# Patient Record
Sex: Male | Born: 1960 | Race: Black or African American | Hispanic: No | Marital: Single | Smoking: Current some day smoker
Health system: Southern US, Community
[De-identification: ages and names within clinical notes are randomized; demographics above are authoritative.]

---

## 2005-02-07 ENCOUNTER — Encounter: Admission: RE | Admit: 2005-02-07 | Discharge: 2005-02-07 | Payer: Self-pay | Admitting: Family Medicine

## 2005-02-11 ENCOUNTER — Encounter: Admission: RE | Admit: 2005-02-11 | Discharge: 2005-02-11 | Payer: Self-pay | Admitting: Family Medicine

## 2006-06-16 IMAGING — CT CT ABDOMEN W/ CM
1 of 2 series · 15 of 32 positions shown, 19 images · IV contrast (BAROCAT & WATER & [ID] OMNI 300)
Comparison: None

ABDOMEN CT WITH CONTRAST

CLINICAL DATA: Bilateral lower quadrant abdominal pain, nausea, vomiting
TECHNIQUE: Multidetector CT imaging of the abdomen and pelvis was performed
following the standard protocol during bolus administration of intravenous
contrast.

Contrast:  125 cc Omnipaque 300

[Series 2: a&p w/ · axial · 0.70mm/px · z∈[-442,-32]mm · 15 of 90 slices shown, 19 images]
[im 4/90  soft-tissue]
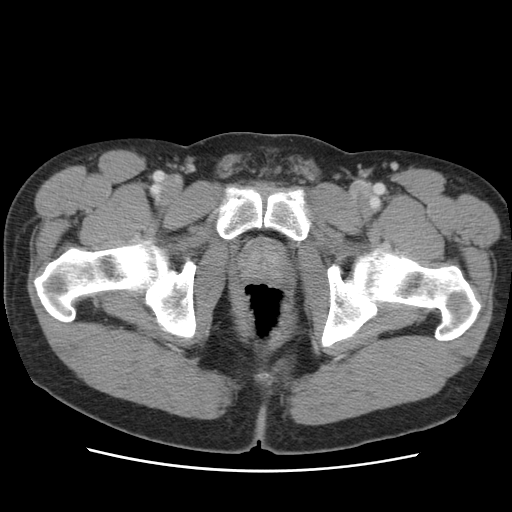
[im 4/90  bone]
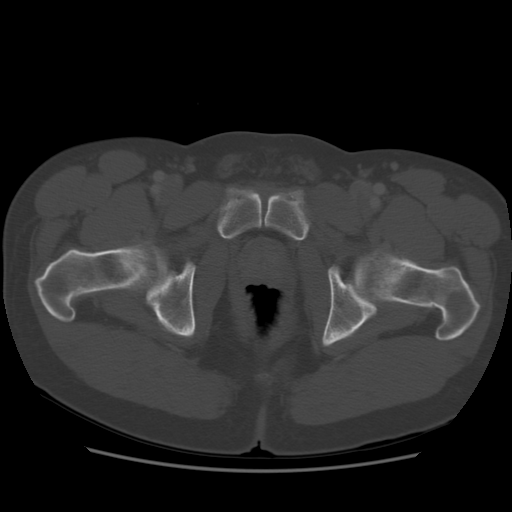
[im 12/90  soft-tissue]
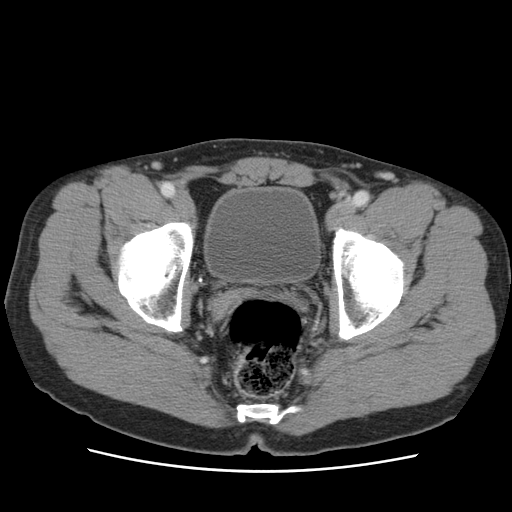
[im 20/90  soft-tissue]
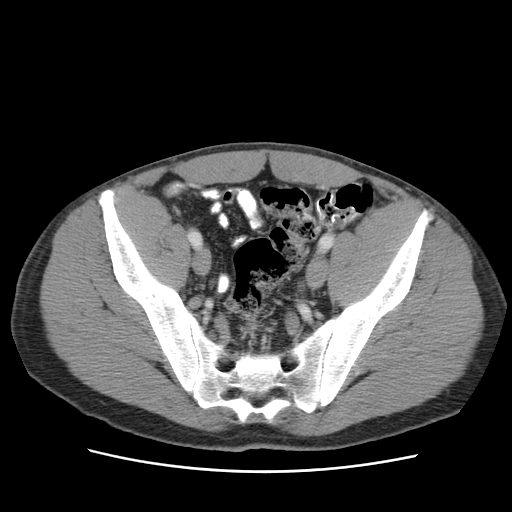
[im 24/90  soft-tissue]
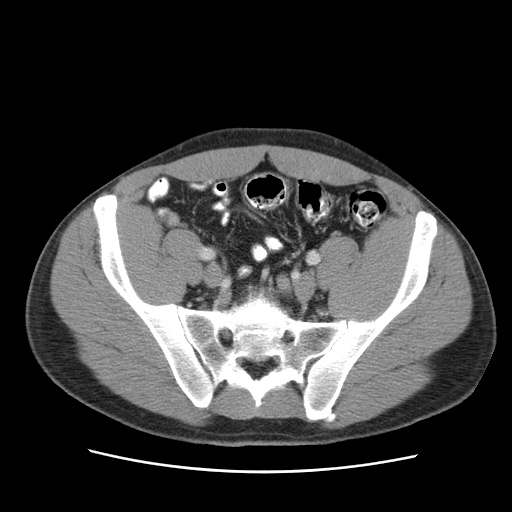
[im 31/90  soft-tissue]
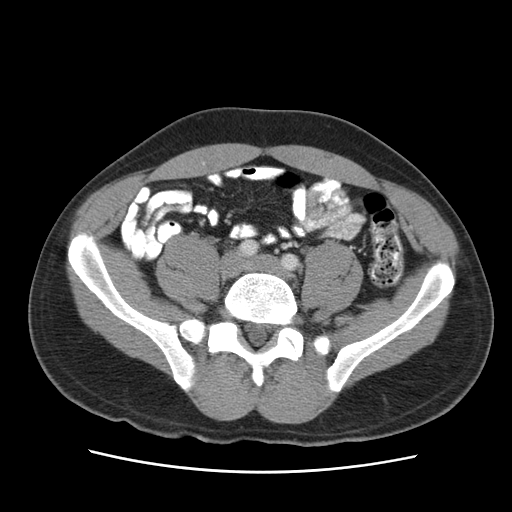
[im 39/90  soft-tissue]
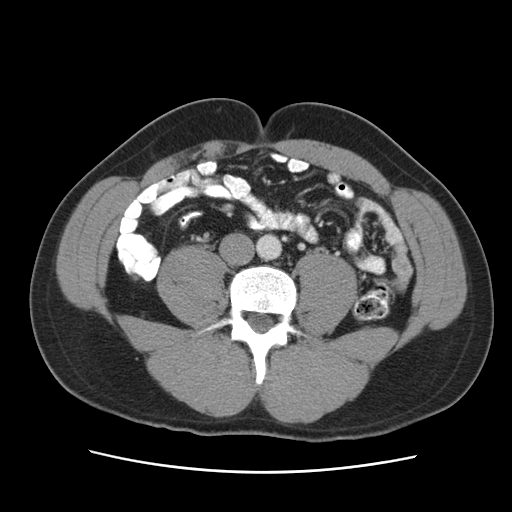
[im 47/90  soft-tissue]
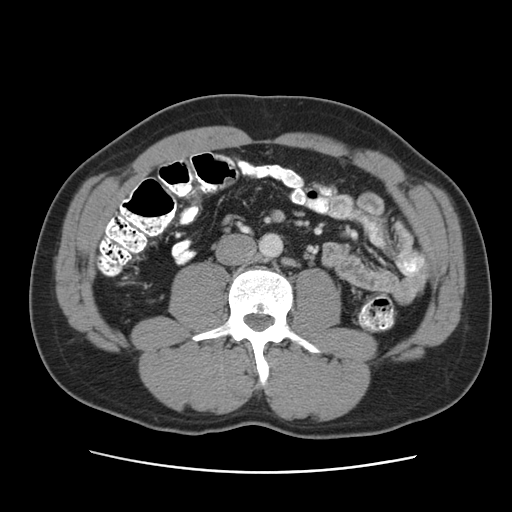
[im 51/90  soft-tissue]
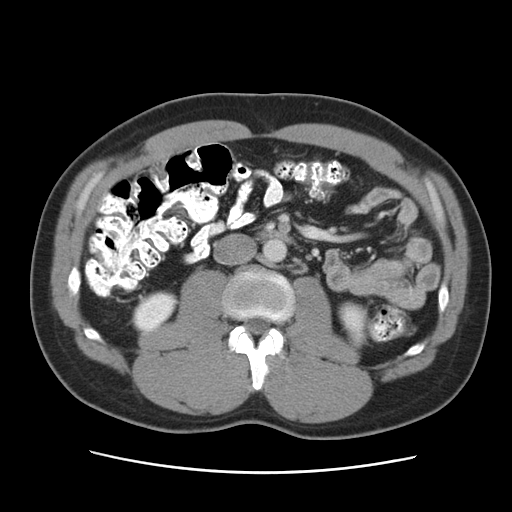
[im 59/90  soft-tissue]
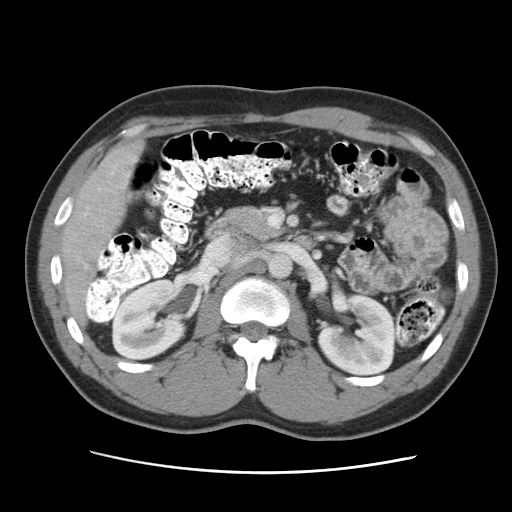
[im 59/90  bone]
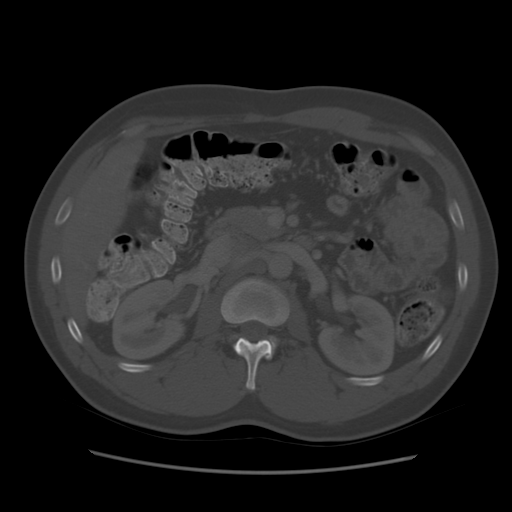
[im 66/90  soft-tissue]
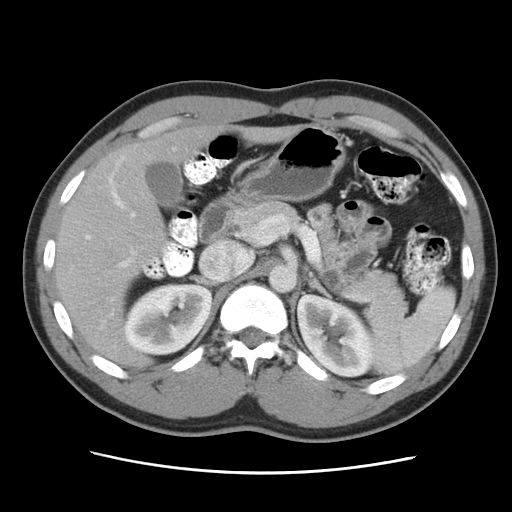
[im 70/90  soft-tissue]
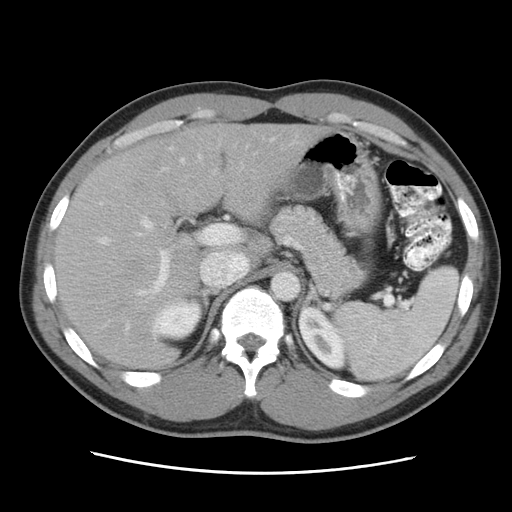
[im 74/90  lung]
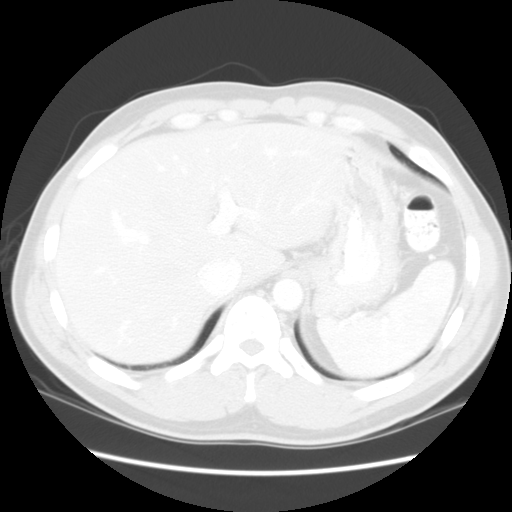
[im 78/90  soft-tissue]
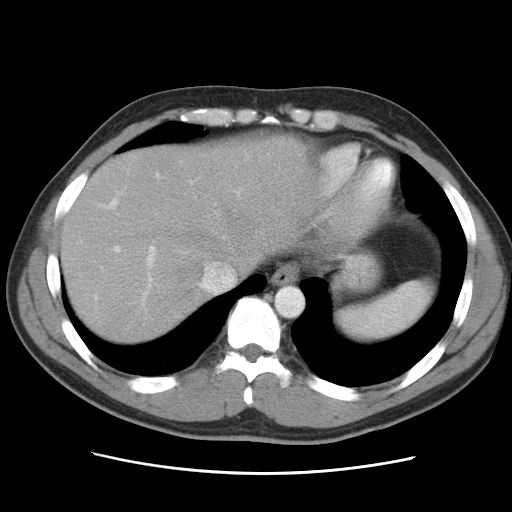
[im 78/90  lung]
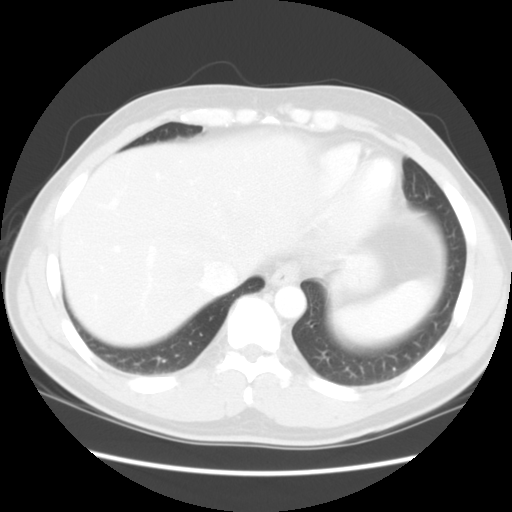
[im 82/90  lung]
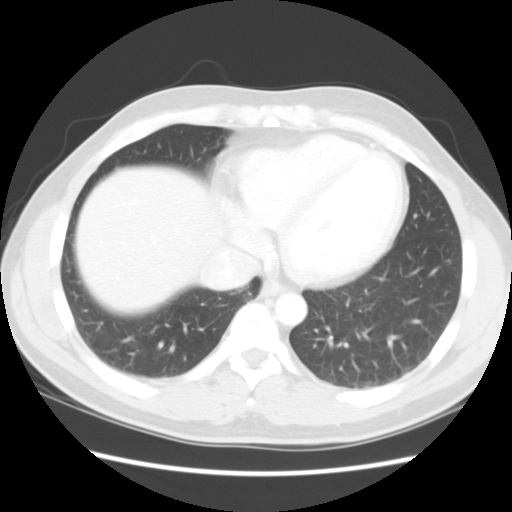
[im 86/90  soft-tissue]
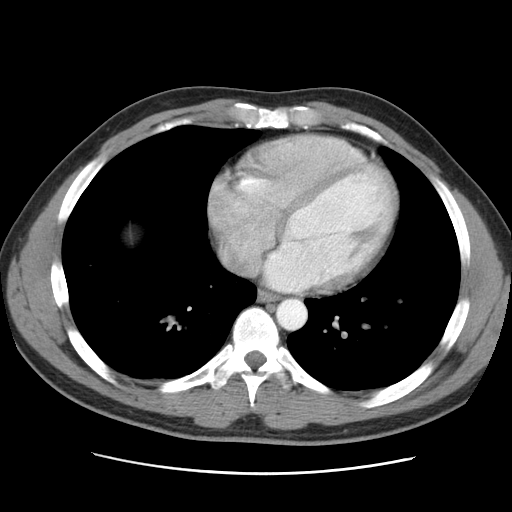
[im 86/90  lung]
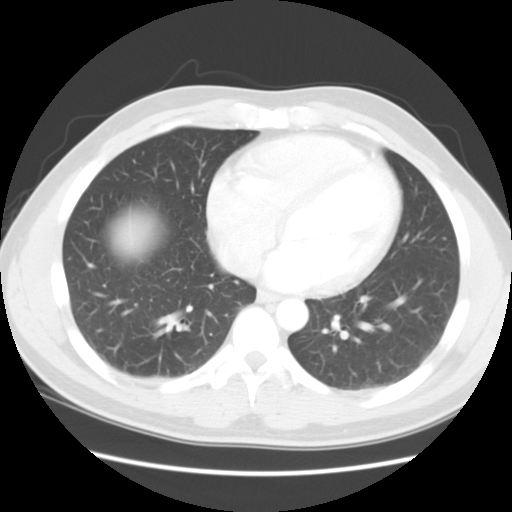

[15 of 32 positions shown; findings below may reference images not displayed]

FINDINGS: Liver, spleen, pancreas, adrenals, kidneys unremarkable. Gallbladder
unremarkable. No free fluid, free air, or adenopathy.

The appendix is mildly prominent measuring 10 mm, but this does contain some gas
and there is no inflammatory change around the appendix. I favor this is normal
for this patient, but recommend clinical correlation for right lower quadrant
pain, fever, or elevated white count.

IMPRESSION

Appendix diameter is above the normal range, but there is no inflammatory change
and the appendix is not fluid filled. I favor that this is normal for this
patient, recommend clinical correlation for right lower quadrant pain, fever, or
elevated white count.

PELVIS CT WITH CONTRAST
FINDINGS: No free fluid, free air, or adenopathy. Pelvic structures
unremarkable. No evidence of diverticulosis or diverticulitis.

IMPRESSION

No acute findings.

## 2010-04-06 ENCOUNTER — Ambulatory Visit (HOSPITAL_COMMUNITY)
Admission: RE | Admit: 2010-04-06 | Discharge: 2010-04-06 | Payer: Self-pay | Source: Home / Self Care | Admitting: Chiropractic Medicine

## 2010-09-09 LAB — CBC
MCH: 31.3 pg (ref 26.0–34.0)
MCHC: 35.7 g/dL (ref 30.0–36.0)
MCV: 87.6 fL (ref 78.0–100.0)
Platelets: 159 10*3/uL (ref 150–400)
RBC: 4.51 MIL/uL (ref 4.22–5.81)
RDW: 12.4 % (ref 11.5–15.5)

## 2010-09-09 LAB — BASIC METABOLIC PANEL
GFR calc non Af Amer: >60 mL/min (ref >60)
Potassium: 4.1 mEq/L (ref 3.5–5.1)
Sodium: 139 mEq/L (ref 135–145)

## 2010-09-09 LAB — DIFFERENTIAL
Basophils Absolute: 0 10*3/uL (ref 0.0–0.1)
Basophils Relative: 1 % (ref 0–1)
Eosinophils Absolute: 0.2 10*3/uL (ref 0.0–0.7)
Eosinophils Relative: 5 % (ref 0–5)
Neutrophils Relative %: 55 % (ref 43–77)

## 2010-09-09 LAB — PROTIME-INR
INR: 1 (ref 0.00–1.49)
Prothrombin Time: 13.4 seconds (ref 11.6–15.2)

## 2011-07-18 ENCOUNTER — Ambulatory Visit (INDEPENDENT_AMBULATORY_CARE_PROVIDER_SITE_OTHER): Payer: BC Managed Care – PPO

## 2011-07-18 DIAGNOSIS — Z7251 High risk heterosexual behavior: Secondary | ICD-10-CM

## 2013-12-26 ENCOUNTER — Other Ambulatory Visit: Payer: Self-pay | Admitting: Family Medicine

## 2013-12-26 ENCOUNTER — Ambulatory Visit
Admission: RE | Admit: 2013-12-26 | Discharge: 2013-12-26 | Disposition: A | Discharge status: Home / Self Care | Payer: 59 | Source: Ambulatory Visit | Attending: Family Medicine | Admitting: Family Medicine

## 2013-12-26 DIAGNOSIS — M25562 Pain in left knee: Secondary | ICD-10-CM

## 2016-06-08 ENCOUNTER — Ambulatory Visit (INDEPENDENT_AMBULATORY_CARE_PROVIDER_SITE_OTHER): Payer: Managed Care, Other (non HMO)

## 2016-06-08 ENCOUNTER — Encounter: Payer: Self-pay | Admitting: Urgent Care

## 2016-06-08 ENCOUNTER — Ambulatory Visit (INDEPENDENT_AMBULATORY_CARE_PROVIDER_SITE_OTHER): Payer: Managed Care, Other (non HMO) | Admitting: Urgent Care

## 2016-06-08 VITALS — BP 120/76 | HR 82 | Temp 98.5°F | Ht 73.25 in | Wt 224.0 lb

## 2016-06-08 DIAGNOSIS — Z23 Encounter for immunization: Secondary | ICD-10-CM | POA: Diagnosis not present

## 2016-06-08 DIAGNOSIS — Z0000 Encounter for general adult medical examination without abnormal findings: Secondary | ICD-10-CM

## 2016-06-08 DIAGNOSIS — Z113 Encounter for screening for infections with a predominantly sexual mode of transmission: Secondary | ICD-10-CM

## 2016-06-08 DIAGNOSIS — R221 Localized swelling, mass and lump, neck: Secondary | ICD-10-CM

## 2016-06-08 NOTE — Patient Instructions (Addendum)
Keeping you healthy  Get these tests  Blood pressure- Have your blood pressure checked once a year by your healthcare provider.  Normal blood pressure is 120/80  Weight- Have your body mass index (BMI) calculated to screen for obesity.  BMI is a measure of body fat based on height and weight. You can also calculate your own BMI at www.[facility].com/bmi/.  Cholesterol- Have your cholesterol checked every year.  Diabetes- Have your blood sugar checked regularly if you have high blood pressure, high cholesterol, have a family history of diabetes or if you are overweight.  Screening for Colon Cancer- Colonoscopy starting at age 50.  Screening may begin sooner depending on your family history and other health conditions. Follow up colonoscopy as directed by your Gastroenterologist.  Screening for Prostate Cancer- Both blood work (PSA) and a rectal exam help screen for Prostate Cancer.  Screening begins at age 40 with African-American men and at age 50 with Caucasian men.  Screening may begin sooner depending on your family history.  Take these medicines  Aspirin- One aspirin daily can help prevent Heart disease and Stroke.  Flu shot- Every fall.  Tetanus- Every 10 years.  Zostavax- Once after the age of 60 to prevent Shingles.  Pneumonia shot- Once after the age of 65; if you are younger than 65, ask your healthcare provider if you need a Pneumonia shot.  Take these steps  Don't smoke- If you do smoke, talk to your doctor about quitting.  For tips on how to quit, go to www.smokefree.gov or call 1-800-QUIT-NOW.  Be physically active- Exercise 5 days a week for at least 30 minutes.  If you are not already physically active start slow and gradually work up to 30 minutes of moderate physical activity.  Examples of moderate activity include walking briskly, mowing the yard, dancing, swimming, bicycling, etc.  Eat a healthy diet- Eat a variety of healthy food such as fruits, vegetables, low  fat milk, low fat cheese, yogurt, lean meant, poultry, fish, beans, tofu, etc. For more information go to www.thenutritionsource.org  Drink alcohol in moderation- Limit alcohol intake to less than two drinks a day. Never drink and drive.  Dentist- Brush and floss twice daily; visit your dentist twice a year.  Depression- Your emotional health is as important as your physical health. If you're feeling down, or losing interest in things you would normally enjoy please talk to your healthcare provider.  Eye exam- Visit your eye doctor every year.  Safe sex- If you may be exposed to a sexually transmitted infection, use a condom.  Seat belts- Seat belts can save your life; always wear one.  Smoke/Carbon Monoxide detectors- These detectors need to be installed on the appropriate level of your home.  Replace batteries at least once a year.  Skin cancer- When out in the sun, cover up and use sunscreen 15 SPF or higher.  Violence- If anyone is threatening you, please tell your healthcare provider.  Living Will/ Health care power of attorney- Speak with your healthcare provider and family.    IF you received an x-ray today, you will receive an invoice from [facility]. Please contact [facility] at [phone] with questions or concerns regarding your invoice.   IF you received labwork today, you will receive an invoice from [facility]. Please contact [facility] at [phone] with questions or concerns regarding your invoice.   Our billing staff will not be able to assist you with questions regarding bills from these companies.    You will be contacted with the lab results as soon as they are available. The fastest way to get your results is to activate your My Chart account. Instructions are located on the last page of this paperwork. If you have not heard from us regarding the results in 2 weeks, please contact this office.      

## 2016-06-08 NOTE — Progress Notes (Signed)
MRN: [id number]  Subjective:   [name] is a 55 y.o. male presenting for annual physical exam.  Patient is currently single, works as a Forensic psychologistplastic technician. Has 1 adult child, has good relationships at home, has a good support network. Would like STI screening. Smokes 1 cigar every other week. Has occasional alcohol drink. Tries to eat healthily and exercises regularly.  Medical care team includes: PCP: No primary care provider on file. Vision: Wears contacts, last eye exam was 03/2016 and received new prescription then. Dental: Cleanings every 6 months. Specialists: None. Health Maintenance: Last colonoscopy was in 2014, was normal, is on 10 year follow up.  Ricky Phillips does not have any active problems on his problem list.   Ricky Phillips is not currently taking any medications. He has No Known Allergies.  Ricky Phillips denies past medical history. Denies past surgical history. Family history is positive for lung cancer in his father, heart disease in his mother.   Immunizations: Will update flu shot today.  Review of Systems  Constitutional: Negative for chills, diaphoresis, fever, malaise/fatigue and weight loss.  HENT: Negative for congestion, ear discharge, ear pain, hearing loss, nosebleeds, sore throat and tinnitus.   Eyes: Negative for blurred vision, double vision, photophobia, pain, discharge and redness.  Respiratory: Negative for cough, shortness of breath and wheezing.   Cardiovascular: Negative for chest pain, palpitations and leg swelling.  Gastrointestinal: Negative for abdominal pain, blood in stool, constipation, diarrhea, nausea and vomiting.  Genitourinary: Negative for dysuria, flank pain, frequency, hematuria and urgency.  Musculoskeletal: Negative for back pain, joint pain and myalgias.  Skin: Negative for itching and rash.  Neurological: Negative for dizziness, tingling, seizures, loss of consciousness, weakness and headaches.  Endo/Heme/Allergies: Negative  for polydipsia.  Psychiatric/Behavioral: Negative for depression, hallucinations, memory loss, substance abuse and suicidal ideas. The patient is not nervous/anxious and does not have insomnia.    Objective:   Vitals: BP 120/76 (BP Location: Right Arm, Patient Position: Sitting, Cuff Size: Large)   Pulse 82   Temp 98.5 F (36.9 C) (Oral)   Ht 6' 1.25" (1.861 m)   Wt 224 lb (101.6 kg)   SpO2 98%   BMI 29.35 kg/m   Physical Exam  Constitutional: He is oriented to person, place, and time. He appears well-developed and well-nourished.  HENT:  TM's intact bilaterally, no effusions or erythema. Nasal turbinates pink and moist, nasal passages patent. No sinus tenderness. Oropharynx clear, mucous membranes moist, dentition in good repair.  Eyes: Conjunctivae and EOM are normal. Pupils are equal, round, and reactive to light. Right eye exhibits no discharge. Left eye exhibits no discharge. No scleral icterus.  Neck: Normal range of motion. Neck supple. No thyromegaly present.  Cardiovascular: Normal rate, regular rhythm and intact distal pulses.  Exam reveals no gallop and no friction rub.   No murmur heard. Pulmonary/Chest: No stridor. No respiratory distress. He has no wheezes. He has no rales.  Abdominal: Soft. Bowel sounds are normal. He exhibits no distension and no mass. There is no tenderness.  Musculoskeletal: Normal range of motion. He exhibits no edema or tenderness.  Lymphadenopathy:    He has no cervical adenopathy.  Neurological: He is alert and oriented to person, place, and time. He has normal reflexes.  Skin: Skin is warm and dry. No rash noted. No erythema. No pallor.  Psychiatric: He has a normal mood and affect.   Dg Neck Soft Tissue  Result Date: 06/08/2016 CLINICAL DATA:  Posterior neck mass EXAM: NECK  SOFT TISSUES - 1+ VIEW COMPARISON:  None. FINDINGS: Seven cervical segments are well visualized. Degenerative changes are noted. No prevertebral soft tissue  abnormality is seen. Some fullness is noted at the base of the neck posteriorly. This should be correlated clinically as it may represent the focal abnormality. It appears to represent prominent subcutaneous fat. CT would be helpful for further evaluation as indicated. 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 CT would be better suited for characterization of this area. These results will be called to the ordering clinician or representative by the Radiologist Assistant, and communication documented in the PACS or zVision Dashboard. Electronically Signed   By: Alcide CleverMark  Lukens M.D.   On: 06/08/2016 10:15   Assessment and Plan :   1. Annual physical exam - Medically healthy, labs pending. Discussed healthy lifestyle, diet, exercise, preventative care, vaccinations, and addressed patient's concerns.   2. Screen for STD (sexually transmitted disease) - HIV antibody - RPR - GC/Chlamydia Probe Amp  3. Need for prophylactic vaccination and inoculation against influenza - Flu Vaccine QUAD 36+ mos IM  4. Neck mass - Reviewed radiology report with patient. He prefers to monitor the mass and wait on work-up or excision. He will f/u as needed.  Wallis BambergMario Keny Donald, PA-C [facility] [phone] 06/08/2016  9:15 AM

## 2016-06-09 LAB — COMPREHENSIVE METABOLIC PANEL
A/G RATIO: 1.4 (ref 1.2–2.2)
ALT: 24 IU/L (ref 0–44)
AST: 19 IU/L (ref 0–40)
Albumin: 4.4 g/dL (ref 3.5–5.5)
Alkaline Phosphatase: 66 IU/L (ref 39–117)
BUN/Creatinine Ratio: 13 (ref 9–20)
BUN: 14 mg/dL (ref 6–24)
Bilirubin Total: 0.5 mg/dL (ref 0.0–1.2)
CALCIUM: 9.5 mg/dL (ref 8.7–10.2)
CO2: 25 mmol/L (ref 18–29)
Chloride: 102 mmol/L (ref 96–106)
Creatinine, Ser: 1.1 mg/dL (ref 0.76–1.27)
GFR calc Af Amer: 88 mL/min/{1.73_m2} (ref >59)
GFR, EST NON AFRICAN AMERICAN: 76 mL/min/{1.73_m2} (ref >59)
GLOBULIN, TOTAL: 3.1 g/dL (ref 1.5–4.5)
Glucose: 100 mg/dL — ABNORMAL HIGH (ref 65–99)
POTASSIUM: 4.1 mmol/L (ref 3.5–5.2)
SODIUM: 143 mmol/L (ref 134–144)
Total Protein: 7.5 g/dL (ref 6.0–8.5)

## 2016-06-09 LAB — CBC
HEMATOCRIT: 41.1 % (ref 37.5–51.0)
Hemoglobin: 15.1 g/dL (ref 13.0–17.7)
MCH: 32.5 pg (ref 26.6–33.0)
MCHC: 36.7 g/dL — AB (ref 31.5–35.7)
MCV: 88 fL (ref 79–97)
Platelets: 186 10*3/uL (ref 150–379)
RBC: 4.65 x10E6/uL (ref 4.14–5.80)
RDW: 13.2 % (ref 12.3–15.4)
WBC: 5.3 10*3/uL (ref 3.4–10.8)

## 2016-06-09 LAB — LIPID PANEL
CHOL/HDL RATIO: 3.7 ratio (ref 0.0–5.0)
CHOLESTEROL TOTAL: 183 mg/dL (ref 100–199)
HDL: 50 mg/dL (ref >39)
LDL CALC: 118 mg/dL — AB (ref 0–99)
Triglycerides: 75 mg/dL (ref 0–149)
VLDL Cholesterol Cal: 15 mg/dL (ref 5–40)

## 2016-06-09 LAB — HIV ANTIBODY (ROUTINE TESTING W REFLEX): HIV Screen 4th Generation wRfx: NONREACTIVE

## 2016-06-09 LAB — RPR: RPR: NONREACTIVE

## 2016-06-09 LAB — PSA: Prostate Specific Ag, Serum: 1.2 ng/mL (ref 0.0–4.0)

## 2016-06-10 LAB — GC/CHLAMYDIA PROBE AMP
CHLAMYDIA, DNA PROBE: NEGATIVE
Neisseria gonorrhoeae by PCR: NEGATIVE

## 2016-12-02 ENCOUNTER — Ambulatory Visit: Payer: Managed Care, Other (non HMO) | Admitting: Physician Assistant

## 2017-03-09 ENCOUNTER — Ambulatory Visit (INDEPENDENT_AMBULATORY_CARE_PROVIDER_SITE_OTHER): Payer: Managed Care, Other (non HMO) | Admitting: Orthopedic Surgery

## 2017-04-13 ENCOUNTER — Ambulatory Visit (INDEPENDENT_AMBULATORY_CARE_PROVIDER_SITE_OTHER): Payer: BLUE CROSS/BLUE SHIELD

## 2017-04-13 ENCOUNTER — Ambulatory Visit (INDEPENDENT_AMBULATORY_CARE_PROVIDER_SITE_OTHER): Payer: BLUE CROSS/BLUE SHIELD | Admitting: Orthopedic Surgery

## 2017-04-13 ENCOUNTER — Encounter (INDEPENDENT_AMBULATORY_CARE_PROVIDER_SITE_OTHER): Payer: Self-pay | Admitting: Orthopedic Surgery

## 2017-04-13 DIAGNOSIS — M25511 Pain in right shoulder: Secondary | ICD-10-CM | POA: Diagnosis not present

## 2017-04-13 DIAGNOSIS — G8929 Other chronic pain: Secondary | ICD-10-CM

## 2017-04-13 DIAGNOSIS — M25512 Pain in left shoulder: Secondary | ICD-10-CM

## 2017-04-15 NOTE — Progress Notes (Signed)
   Office Visit Note   Patient: Ricky Phillips           Date of Birth: 07/30/1960           MRN: [id number] Visit Date: 04/13/2017 Requested by: No referring provider defined for this encounter. PCP: Blair HeysEhinger, Robert, MD  Subjective: Chief Complaint  Patient presents with  . Right Shoulder - Pain  . Left Shoulder - Pain    HPI: Ricky Phillips is a 56 year old patient with bilateral shoulder pain right worse than left.  He describes waking from sleep at night with pain.  Reports some numbness and tingling but no neck pain.  8 years ago he underwent shoulder rotator cuff repair surgery.  He describes pain but no weakness.  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  He has tried over-the-counter medication without much relief.  He is unable to lift weights.              ROS: All systems reviewed are negative as they relate to the chief complaint within the history of present illness.  Patient denies  fevers or chills.   Assessment & Plan: Visit Diagnoses:  1. Chronic pain of both shoulders     Plan: impression is right shoulder pain with history of rotator cuff tear repair with possible re-tear of that rotator cuff tendon based on examination and narrowing of the acromiohumeral distance on plain x-rays.  Pain has been going on for 6 months.  Plan MRI arthrogram right shoulder to evaluate recurrent rotator cuff tear  Follow-Up Instructions: Return for after MRI.   Orders:  Orders Placed This Encounter  Procedures  . XR Shoulder Right  . XR Shoulder Left  . MR SHOULDER RIGHT W CONTRAST  . Arthrogram   No orders of the defined types were placed in this encounter.     Procedures: No procedures performed   Clinical Data: No additional findings.  Objective: Vital Signs: There were no vitals taken for this visit.  Physical Exam:   Constitutional: Patient appears well-developed HEENT:  Head: Normocephalic Eyes:EOM are normal Neck: Normal range of  motion Cardiovascular: Normal rate Pulmonary/chest: Effort normal Neurologic: Patient is alert Skin: Skin is warm Psychiatric: Patient has normal mood and affect    Ortho Exam: orthopedic exam demonstrates full active and passive range of motion of both shoulders but with coarseness with internal and neck sternal rotation at 90 of abduction.  Grip strength is intact.hesias C5-T1.  Neck range of motion is full.  Positive impingement signs right negative left.  No acromioclavicular joint tenderness.  Specialty Comments:  No specialty comments available.  Imaging: No results found.   PMFS History: There are no active problems to display for this patient.  No past medical history on file.  No family history on file.  No past surgical history on file. Social History   Occupational History  . Not on file.   Social History Main Topics  . Smoking status: Current Some Day Smoker    Types: Cigars  . Smokeless tobacco: Never Used  . Alcohol use Not on file  . Drug use: Unknown  . Sexual activity: Not on file

## 2017-05-08 ENCOUNTER — Ambulatory Visit
Admission: RE | Admit: 2017-05-08 | Discharge: 2017-05-08 | Disposition: A | Discharge status: Home / Self Care | Payer: 59 | Source: Ambulatory Visit | Attending: Orthopedic Surgery | Admitting: Orthopedic Surgery

## 2017-05-08 ENCOUNTER — Other Ambulatory Visit: Payer: 59

## 2017-05-08 ENCOUNTER — Ambulatory Visit
Admission: RE | Admit: 2017-05-08 | Discharge: 2017-05-08 | Disposition: A | Discharge status: Home / Self Care | Payer: BLUE CROSS/BLUE SHIELD | Source: Ambulatory Visit | Attending: Orthopedic Surgery | Admitting: Orthopedic Surgery

## 2017-05-08 DIAGNOSIS — M25512 Pain in left shoulder: Principal | ICD-10-CM

## 2017-05-08 DIAGNOSIS — G8929 Other chronic pain: Secondary | ICD-10-CM

## 2017-05-08 DIAGNOSIS — M25511 Pain in right shoulder: Principal | ICD-10-CM

## 2017-05-08 MED ORDER — IOPAMIDOL (ISOVUE-M 200) INJECTION 41%
20.0000 mL | Freq: Once | INTRAMUSCULAR | Status: AC
  Administered 2017-05-08: 20 mL via INTRA_ARTICULAR

## 2017-05-10 ENCOUNTER — Encounter (INDEPENDENT_AMBULATORY_CARE_PROVIDER_SITE_OTHER): Payer: Self-pay | Admitting: Orthopedic Surgery

## 2017-05-10 ENCOUNTER — Ambulatory Visit (INDEPENDENT_AMBULATORY_CARE_PROVIDER_SITE_OTHER): Payer: BLUE CROSS/BLUE SHIELD | Admitting: Orthopedic Surgery

## 2017-05-10 DIAGNOSIS — G8929 Other chronic pain: Secondary | ICD-10-CM

## 2017-05-10 DIAGNOSIS — M25511 Pain in right shoulder: Secondary | ICD-10-CM

## 2017-05-10 DIAGNOSIS — M25512 Pain in left shoulder: Secondary | ICD-10-CM

## 2017-05-10 MED ORDER — IBUPROFEN-FAMOTIDINE 800-26.6 MG PO TABS: ORAL_TABLET | ORAL | 0 refills | Status: AC

## 2017-05-13 ENCOUNTER — Encounter (INDEPENDENT_AMBULATORY_CARE_PROVIDER_SITE_OTHER): Payer: Self-pay | Admitting: Orthopedic Surgery

## 2017-05-13 NOTE — Progress Notes (Signed)
   Office Visit Note   Patient: Ricky Phillips           Date of Birth: 01/17/1961           MRN: [id number] Visit Date: 05/10/2017 Requested by: Blair HeysEhinger, Robert, MD [address] [place], [place] [address] PCP: Blair HeysEhinger, Robert, MD  Subjective: Chief Complaint  Patient presents with  . Follow-up    review MRI scan    HPI: Ricky Phillips is a patient with right shoulder pain.  Since I have seen him he has had an MRI scan of the right shoulder.  That scan is reviewed.  Shows possibly a pinhole tear in the infraspinatus.  The repaired supraspinatus is intact.  He describes fairly minimal symptoms with this right shoulder other than occasional pain.  Denies much in the way of mechanical symptoms.              ROS: All systems reviewed are negative as they relate to the chief complaint within the history of present illness.  Patient denies  fevers or chills.   Assessment & Plan: Visit Diagnoses:  1. Chronic pain of both shoulders     Plan: Impression is right shoulder pain with possible pinhole infraspinatus tendon tear.  Patient really doesn't want to proceed with any type of workup at this time.  Does not want to go with further surgery.  I think this is something we can watch for now.  I want to try him on Duexis one a day as needed for his symptoms.  Could consider repeat clinical check in 6 months to make sure that no further worsening of clinical symptoms is occurring  Follow-Up Instructions: Return if symptoms worsen or fail to improve.   Orders:  No orders of the defined types were placed in this encounter.  Meds ordered this encounter  Medications  . Ibuprofen-Famotidine (DUEXIS) 800-26.6 MG TABS    Sig: 1 po q d prn pain    Dispense:  90 tablet    Refill:  0      Procedures: No procedures performed   Clinical Data: No additional findings.  Objective: Vital Signs: There were no vitals taken for this visit.  Physical Exam:   Constitutional: Patient  appears well-developed HEENT:  Head: Normocephalic Eyes:EOM are normal Neck: Normal range of motion Cardiovascular: Normal rate Pulmonary/chest: Effort normal Neurologic: Patient is alert Skin: Skin is warm Psychiatric: Patient has normal mood and affect    Ortho Exam: Orthopedic exam demonstrates full active and passive range of motion of that right shoulder.  Good rotator cuff strength isolated interspace interspace and subscap muscle testing.  No masses lymph adenopathy or skin changes noted in the right shoulder region.  Well-healed surgical incisions present.  Rotator cuff strength intact on the right to infraspinatus supraspinatus and subscap testing.  Specialty Comments:  No specialty comments available.  Imaging: No results found.   PMFS History: There are no active problems to display for this patient.  History reviewed. No pertinent past medical history.  History reviewed. No pertinent family history.  History reviewed. No pertinent surgical history. Social History   Occupational History  . Not on file  Tobacco Use  . Smoking status: Current Some Day Smoker    Types: Cigars  . Smokeless tobacco: Never Used  Substance and Sexual Activity  . Alcohol use: Not on file  . Drug use: Not on file  . Sexual activity: Not on file

## 2017-09-07 ENCOUNTER — Ambulatory Visit (INDEPENDENT_AMBULATORY_CARE_PROVIDER_SITE_OTHER): Payer: BLUE CROSS/BLUE SHIELD | Admitting: Orthopedic Surgery

## 2017-12-04 DIAGNOSIS — R0989 Other specified symptoms and signs involving the circulatory and respiratory systems: Secondary | ICD-10-CM | POA: Diagnosis not present

## 2018-03-29 DIAGNOSIS — Z131 Encounter for screening for diabetes mellitus: Secondary | ICD-10-CM | POA: Diagnosis not present

## 2018-03-29 DIAGNOSIS — Z13220 Encounter for screening for lipoid disorders: Secondary | ICD-10-CM | POA: Diagnosis not present

## 2018-03-29 DIAGNOSIS — Z0000 Encounter for general adult medical examination without abnormal findings: Secondary | ICD-10-CM | POA: Diagnosis not present

## 2018-09-10 IMAGING — MR MR SHOULDER*R* W/CM
6 series · 40 of 40 positions shown · IV contrast (agent unspecified)
Comparison: MRI dated 02/15/2010

CLINICAL DATA: Chronic right shoulder pain. Previous rotator cuff
repair.

EXAM:
MR ARTHROGRAM OF THE RIGHT SHOULDER
TECHNIQUE: Multiplanar, multisequence MR imaging of the right shoulder was
performed following the administration of intra-articular contrast.
CONTRAST:  See Injection Documentation.

[Series 8: T1 · oblique · 4.0mm · 0.62mm/px · 7 of 16 slices shown]
[im 1/16]
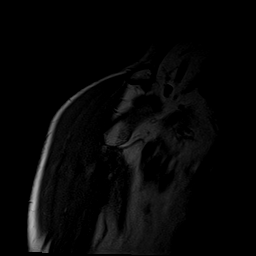
[im 3/16]
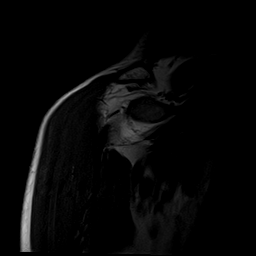
[im 6/16]
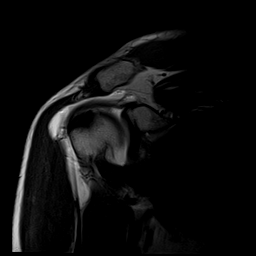
[im 8/16]
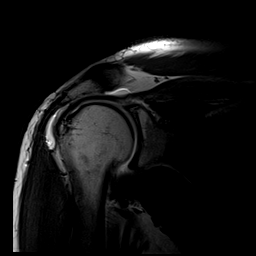
[im 11/16]
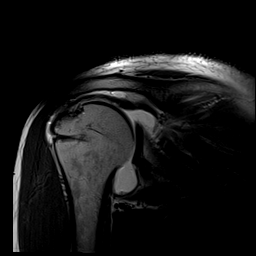
[im 13/16]
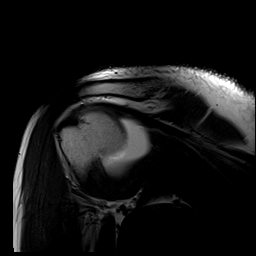
[im 16/16]
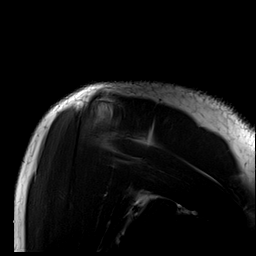

[Series 9: T2 fat-sat · oblique · 4.0mm · 0.62mm/px · 6 of 16 slices shown]
[im 1/16]
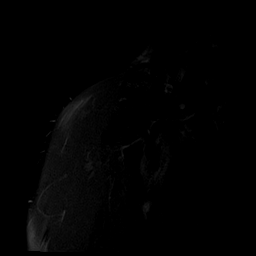
[im 4/16]
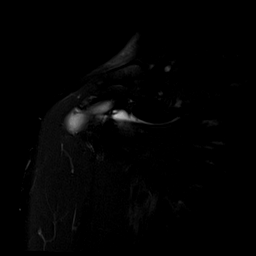
[im 7/16]
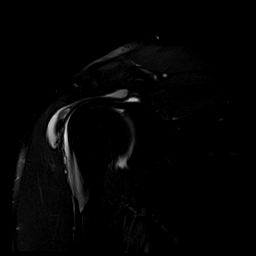
[im 10/16]
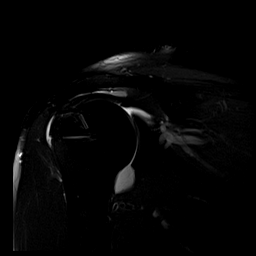
[im 13/16]
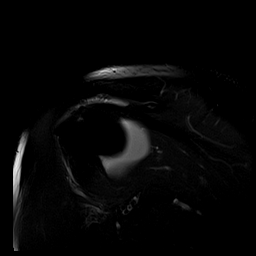
[im 16/16]
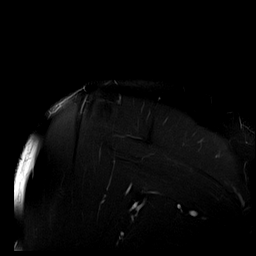

[Series 10: T1 fat-sat · oblique · 4.0mm · 0.62mm/px · 6 of 16 slices shown (1 of 3)]
[im 1/16]
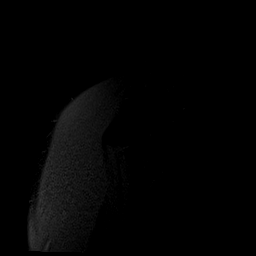
[im 4/16]
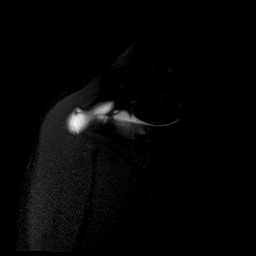
[im 7/16]
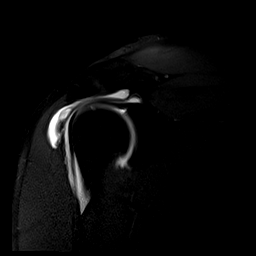
[im 10/16]
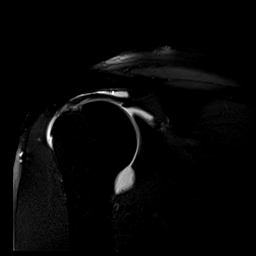
[im 13/16]
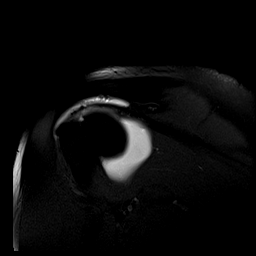
[im 16/16]
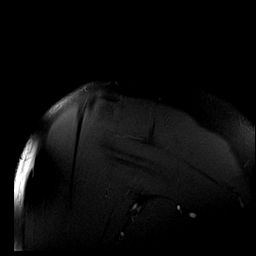

[Series 11: T2 · oblique · 4.0mm · 0.62mm/px · 7 of 18 slices shown]
[im 1/18]
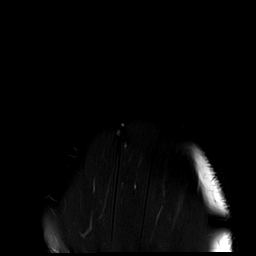
[im 3/18]
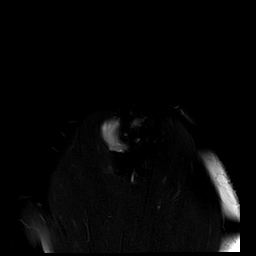
[im 6/18]
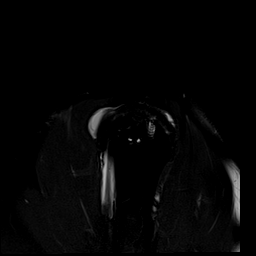
[im 9/18]
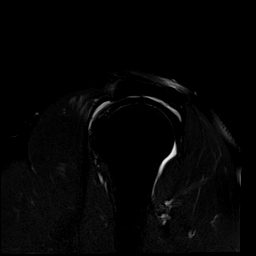
[im 12/18]
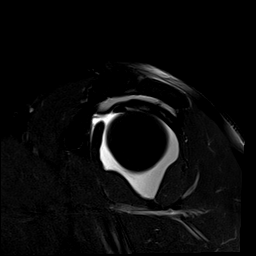
[im 15/18]
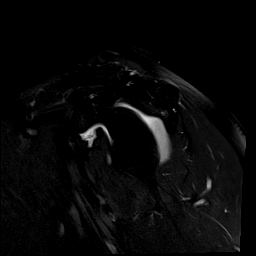
[im 18/18]
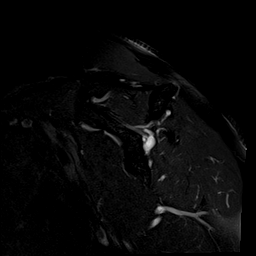

[Series 12: T1 fat-sat · axial · 4.0mm · 0.25mm/px · z∈[-1,+81]mm · 7 of 18 slices shown (2 of 3)]
[im 1/18]
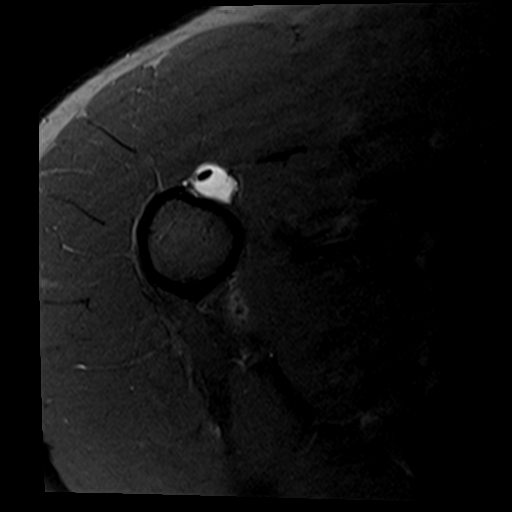
[im 3/18]
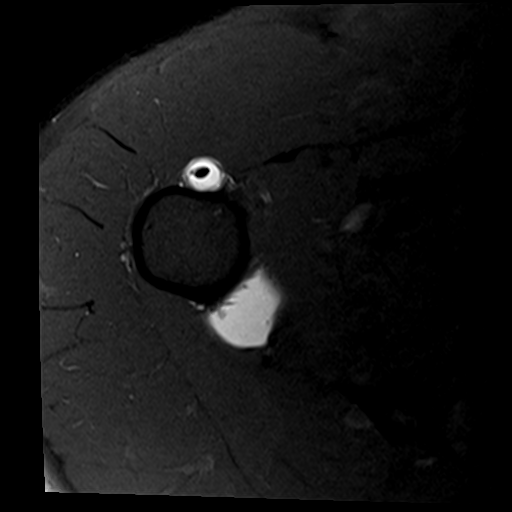
[im 6/18]
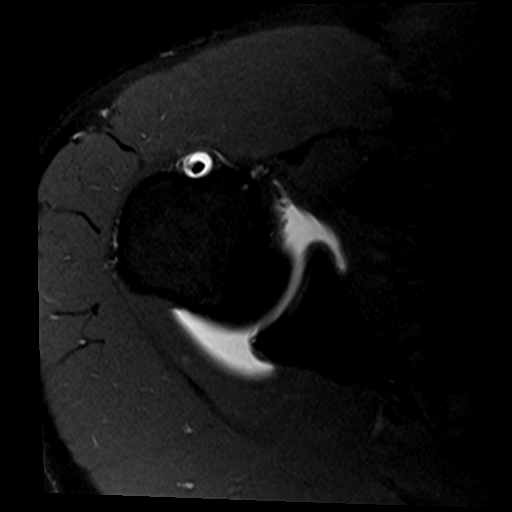
[im 9/18]
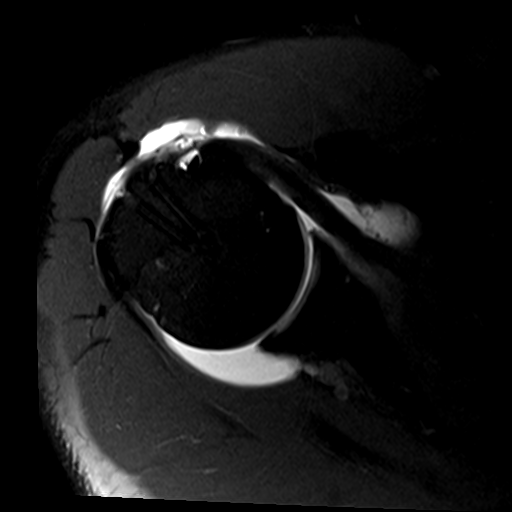
[im 12/18]
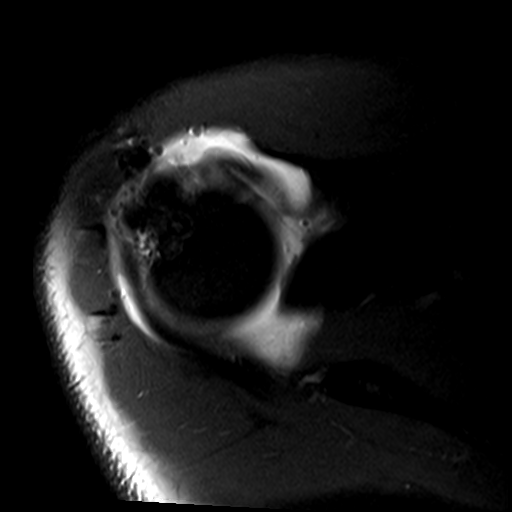
[im 15/18]
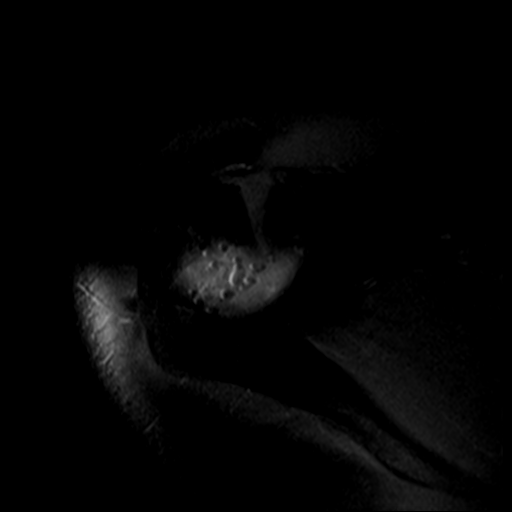
[im 18/18]
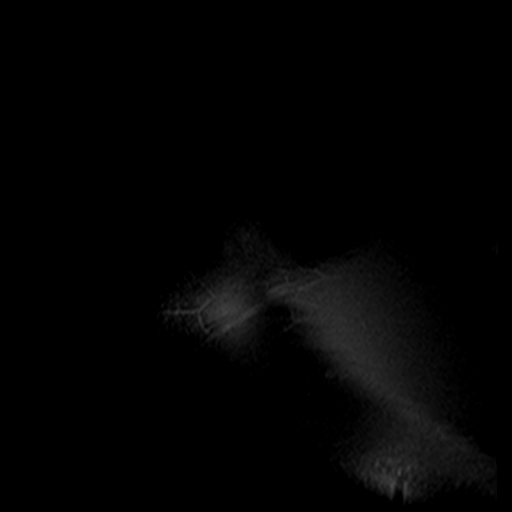

[Series 16: T1 fat-sat · sagittal · 4.0mm · 0.62mm/px · 7 of 18 slices shown (3 of 3)]
[im 1/18]
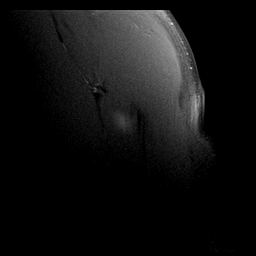
[im 3/18]
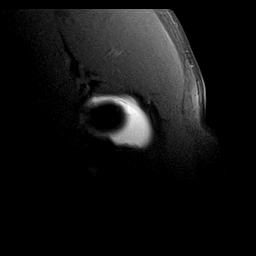
[im 6/18]
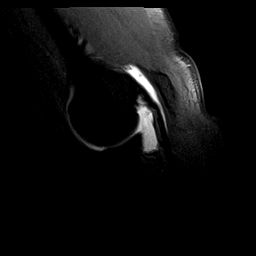
[im 9/18]
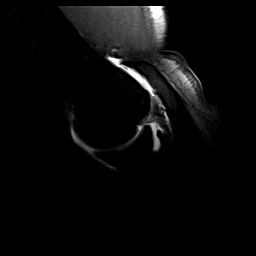
[im 12/18]
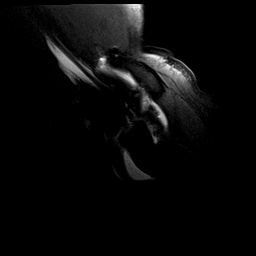
[im 15/18]
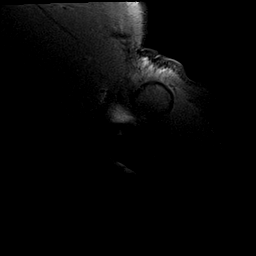
[im 18/18]
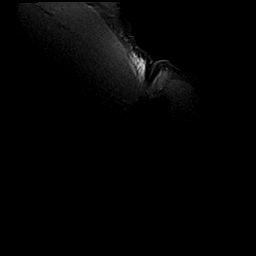

[40 of 40 positions shown; findings below may reference images not displayed]

FINDINGS: There is contrast in the subacromial/subdeltoid bursae without a
definitive full-thickness defect in the rotator cuff.

Rotator cuff: There is a small partial-thickness articular surface
tear of the distal infraspinatus tender adjacent to the posterior
anchor best seen on image 5 of series 10. There may be a pinhole
full-thickness component to this tear on that same image. Repaired
supraspinatus tendon is intact. Subscapularis and teres minor
tendons are intact.

Muscles: No atrophy or edema.

Biceps long head: Properly located and intact.

Acromioclavicular Joint: Normal AC joint.  Type 2 acromion.

Glenohumeral Joint: Normal.

Labrum: Normal.

Bones: Surgical anchors in the greater tuberosity. Otherwise
negative.
IMPRESSION: 1. Small partial-thickness articular surface tear of the distal
infraspinatus tendon. There may be a tiny full-thickness tear at
that site.
2. The repaired supraspinatus tendon appears to be intact.

## 2019-09-19 DIAGNOSIS — Z0000 Encounter for general adult medical examination without abnormal findings: Secondary | ICD-10-CM | POA: Diagnosis not present

## 2019-09-19 DIAGNOSIS — Z125 Encounter for screening for malignant neoplasm of prostate: Secondary | ICD-10-CM | POA: Diagnosis not present

## 2019-09-19 DIAGNOSIS — Z13220 Encounter for screening for lipoid disorders: Secondary | ICD-10-CM | POA: Diagnosis not present
